# Patient Record
Sex: Male | Born: 1945 | Race: White | Hispanic: No | Marital: Married | Smoking: Never smoker
Health system: Southern US, Community
[De-identification: ages and names within clinical notes are randomized; demographics above are authoritative.]

## PROBLEM LIST (undated history)

## (undated) HISTORY — PX: WRIST SURGERY: SHX841

## (undated) HISTORY — PX: BACK SURGERY: SHX140

---

## 2006-12-10 ENCOUNTER — Ambulatory Visit (HOSPITAL_COMMUNITY): Admission: RE | Admit: 2006-12-10 | Discharge: 2006-12-10 | Payer: Self-pay | Admitting: Neurosurgery

## 2007-02-10 ENCOUNTER — Encounter: Admission: RE | Admit: 2007-02-10 | Discharge: 2007-02-10 | Payer: Self-pay | Admitting: Neurosurgery

## 2007-02-12 ENCOUNTER — Encounter: Admission: RE | Admit: 2007-02-12 | Discharge: 2007-02-12 | Payer: Self-pay | Admitting: Neurosurgery

## 2007-02-23 ENCOUNTER — Encounter: Admission: RE | Admit: 2007-02-23 | Discharge: 2007-02-23 | Payer: Self-pay | Admitting: Neurosurgery

## 2007-03-18 ENCOUNTER — Inpatient Hospital Stay (HOSPITAL_COMMUNITY): Admission: RE | Admit: 2007-03-18 | Discharge: 2007-03-19 | Payer: Self-pay | Admitting: Neurosurgery

## 2009-02-15 ENCOUNTER — Encounter: Admission: RE | Admit: 2009-02-15 | Discharge: 2009-02-15 | Payer: Self-pay | Admitting: Neurosurgery

## 2009-03-07 ENCOUNTER — Ambulatory Visit (HOSPITAL_COMMUNITY): Admission: RE | Admit: 2009-03-07 | Discharge: 2009-03-08 | Payer: Self-pay | Admitting: Neurosurgery

## 2010-06-23 ENCOUNTER — Encounter: Payer: Self-pay | Admitting: Neurosurgery

## 2010-09-05 LAB — BASIC METABOLIC PANEL
BUN: 19 mg/dL (ref 6–23)
CO2: 29 mEq/L (ref 19–32)
Calcium: 9.7 mg/dL (ref 8.4–10.5)
Chloride: 106 mEq/L (ref 96–112)
Creatinine, Ser: 0.92 mg/dL (ref 0.4–1.5)
GFR calc Af Amer: >60 mL/min (ref >60)
GFR calc non Af Amer: >60 mL/min (ref >60)
Glucose, Bld: 100 mg/dL — ABNORMAL HIGH (ref 70–99)
Potassium: 4.4 mEq/L (ref 3.5–5.1)
Sodium: 140 mEq/L (ref 135–145)

## 2010-09-05 LAB — CBC
HCT: 43.7 % (ref 39.0–52.0)
Hemoglobin: 14.8 g/dL (ref 13.0–17.0)
MCHC: 33.8 g/dL (ref 30.0–36.0)
MCV: 89.2 fL (ref 78.0–100.0)
Platelets: 194 10*3/uL (ref 150–400)
RBC: 4.9 MIL/uL (ref 4.22–5.81)
RDW: 12.6 % (ref 11.5–15.5)
WBC: 4.9 10*3/uL (ref 4.0–10.5)

## 2010-10-15 NOTE — Op Note (Signed)
NAMEOSEPH, IMBURGIA NO.:  [id number]   MEDICAL RECORD NO.:  [id number]          PATIENT TYPE:  INP   LOCATION:  3023                         FACILITY:  [facility]   PHYSICIAN:  Hewitt Shorts, M.D.DATE OF BIRTH:  10/01/1945   DATE OF PROCEDURE:  03/18/2007  DATE OF DISCHARGE:  03/19/2007                               OPERATIVE REPORT   PREOPERATIVE DIAGNOSES:  Lumbar stenosis, lumbar spondylosis, lumbar  degenerative disk disease and lumbar radiculopathy.   POSTOPERATIVE DIAGNOSES:  Lumbar stenosis, lumbar spondylosis, lumbar  degenerative disk disease and lumbar radiculopathy.   PROCEDURE:  Left L5-S1 lumbar laminotomy, facetectomy and foraminotomy  with decompression of the left L5 nerve root, a left L5-S1 transverse  posterior lumbar interbody fusion with AVS PEEK interbody implant with  mosaic synthetic bone graft with bone marrow aspirate and bilateral L5-  S1 posterolateral arthrodesis with posterior instrumentation,  specifically left L5-S1 pedicle screw fixation with Tektoniks plate and  screws, and interspinous plate and mosaic with bone marrow aspirate with  microdissection.   SURGEON:  Hewitt Shorts, M.D.   ASSISTANT:  Webb Silversmith, NP, and Lovell Sheehan.   ANESTHESIA:  General endotracheal.   INDICATIONS:  The patient is a 65 year old man.  He is a little over 3  months status post a left L5-S1 lumbar laminotomy and foraminotomy for  decompression of left L5-S1 neural foraminal stenosis with resulting  left lumbar radiculopathy.  He did well initially following the surgery,  but the pain began to recur.  He had several left L5-S1 transforaminal  epidural steroid injections that gave transient but not lasting relief,  and the radicular pain steadily worsened and became increasingly  disabling.  And therefore, more definitive decompression and arthrodesis  was elected.   PROCEDURE:  The patient was brought to the operating room and placed  under  general endotracheal anesthesia.  The patient was turned to a  prone position.  Lumbar region was then prepped with Betadine soap and  solution and draped in a sterile fashion.  The midline was infiltrated  with local anesthetic with epinephrine and the previous midline incision  was reopened and the incision was extended both rostrally and caudally.  Dissection was carried down through the subcutaneous tissue to the  lumbar fascia, which was incised bilaterally, and the paraspinal muscles  were dissected off the spinous process and lamina bilaterally.  The  dissection on the left side where the previous surgery had been was more  difficult due to extensive scar tissue.  X-rays were taken, and we  identified the L5-S1 intralaminar space.  And using loupes  magnification, we defined the margins of the previous laminotomy.  We  then extended the laminectomy rostrally.  The scar tissue over the  thecal sac was quite dense, and a dural tear occurred during the  dissection.  We then went ahead and draped the operating microscope.  It  was brought into the operative field to provide additional  magnification, illumination and visualization, and we went ahead and  repaired the dural tear with interrupted and running 6-0 Prolene sutures  and a good watertight  closure was achieved.  At the end of the  decompression and arthrodesis immediately prior to closure, we injected  a layer of Tisseel over the repair.   The left L5-S1 neural foramen was then decompressed by proceeding with a  left L5-S1 facetectomy using the XMax drill and Kerrison punches.  We  identified the nerve root and were able to remove the left S1 superior  facet entirely.  It was hypertrophic, and the overlying facet capsule  was hypertrophic and compressing the nerve root, and this was removed.  The remaining compression arose from the spondylitic disk protrusion,  and this was subsequently removed.   We then proceeded with  diskectomy on the left side at L5-S1, incising  the anulus and entering through the disk space with a variety of  pituitary rongeurs and microcurettes.  A thorough diskectomy was  performed of the degenerated disk material, and then we worked on  removing the spondylitic spurring from the inferior aspect of the L5 and  the superior aspect of S1, particularly laterally as the spondylitic  spurring encroached on the exiting left L5 nerve root.  This bony  overgrowth was removed and the nerve root further decompressed so that  it was fully decompressed from the medial aspect of the left L5 pedicle  laterally and inferiorly into the neural foramen and into the  extraforaminal space.  We then began to prepare the endplates of the  vertebral body for interbody arthrodesis.  The cartilaginous endplate  surfaces were removed using curettes.  And then we selected a 7-mm  transverse posterior lumbar interbody fusion implant.  It was first  tested with a sizer.  And then we went ahead and probed the left S1  pedicle and aspirated bone marrow aspirate.  It was injected over a 15-  mL strip of mosaic synthetic bone graft.  And once that was saturated,  we packed the interbody implant with the mosaic.  We packed mosaic in  the medial aspect of the disk space and then carefully positioned the  interbody implant in the intervertebral disk space and were able to  position it in a good midline position.  We then took additional mosaic  and packed it around the interbody implant.   We then proceeded with the posterolateral arthrodesis.  The C-arm  fluoroscope was draped and brought on the field.  We identified a  pedicle entry site at the L5 level on the left side.  We examined the  pedicle from within the canal and then carefully probed the left L5  pedicle with fluoroscopic guidance.  It was examined with a ball probe.  No cutouts were found.  It was then tapped with a 5-mm tap and then we  selected a  small singlelevel Tektoniks plate and a 16-XW screw was  placed through the plate and into the left L5 pedicle.  We then  reexamined the left S1 pedicle that had been probed earlier.  Pedicle  probe was again placed and a good trajectory was noted.  We then  examined it with a ball probe; no cutouts were found.  The left S1  pedicle was then tapped and then we placed another 40-mm screw through  the plate.  Both screws were fully tightened, and then we placed locking  caps which were fully tightened.  We then selected a Spire interspinous  plate.  A channel was created in the L5-S1 interspinous space and we  selected a 45-mm plate.  It was carefully  set up, passing the post from  right to left under direct vision.  It was connected to the second  plate, and these were brought together.  The tightening clamps were  applied both rostrally and caudally and gently tightened, and then  locking screw the break-off side was tightened and secured.  And once  the system was locked, the tightening clamps were removed.  This was all  again checked with C-arm fluoroscopic imaging.  We then decorticated the  lamina on the right side at L5 and S1 and packed mosaic with bone marrow  aspirate over the decorticated lamina to decorticate the transverse  process at L5 on the left and the ala of S1, and then mosaic was packed  lateral to the Tektoniks plate.  We then injected the Tisseel over the  dural repair.  The wound was checked for hemostasis.  It had been  irrigated numerous times during the procedure with bacitracin solution.  And once hemostasis was confirmed, we proceeded with closure.  The deep  fascia was closed with interrupted undyed #1 Vicryl sutures.  The  subcutaneous and subcuticular were closed with interrupted inverted 2-0  undyed Vicryl sutures and the skin was reapproximated with Dermabond.  The wound was dressed with Adaptic and sterile gauze.  And following the  procedure, the patient  was turned back to the supine position and  reversed from the anesthetic, extubated and transferred to the recovery  room for further care.   ESTIMATED BLOOD LOSS:  200 mL.  We did use the Cell Saver, but there was  insufficient blood loss to spin down the collected blood product.   SPONGE AND NEEDLE COUNT:  Correct.      Hewitt Shorts, M.D.  Electronically Signed     RWN/MEDQ  D:  03/18/2007  T:  03/19/2007  Job:  [id number]

## 2010-10-15 NOTE — Op Note (Signed)
NAMEESVIN, HNAT               ACCOUNT NO.:  [id number]   MEDICAL RECORD NO.:  [id number]          PATIENT TYPE:  OIB   LOCATION:  3172                         FACILITY:  [facility]   PHYSICIAN:  Hewitt Shorts, M.D.DATE OF BIRTH:  1945/11/10   DATE OF PROCEDURE:  12/10/2006  DATE OF DISCHARGE:                               OPERATIVE REPORT   PREOPERATIVE DIAGNOSIS:  Left L5-S1 neuroforaminal stenosis, lumbar  spondylosis, lumbar degenerative disk disease, lumbar radiculopathy.   POSTOPERATIVE DIAGNOSIS:  Left L5-S1 neuroforaminal stenosis, lumbar  spondylosis, lumbar degenerative disk disease, lumbar radiculopathy.   PROCEDURE:  Left L5-S1 lumbar laminotomy and foraminotomy.   SURGEON:  Hewitt Shorts, M.D.   ASSISTANT:  1. Nelia Shi. Webb Silversmith, RN  2. Hilda Lias, M.D.   ANESTHESIA:  General endotracheal.   INDICATION:  The patient is a 65 year old man who presented with left  lumbar radiculopathy with weakness of his left extensor hallucis longus.  He has extensive multilevel degenerative disk disease with spondylosis  with particular encroachment of the left L5-S1 neuroforamen and  compression of the exiting left L5 nerve root.  The decision was made to  proceed with laminotomy and foraminotomy.   PROCEDURE:  The patient was brought to the operating room, placed under  general endotracheal anesthesia.  The patient was turned to a prone  position.  Lumbar region was prepped with Betadine soap solution and  draped in a sterile fashion.  The midline was infiltrated with local  anesthetic with epinephrine.  An x-ray was taken and the L5-S1 level  identified and a midline incision was made over the L5-S1 level and  carried down through the subcutaneous tissue to the lumbar fascia which  was incised on the let side of the midline.  The paraspinal muscles were  dissected to the spinous process and lamina in a subperiosteal fashion  and an x-ray was taken and the L5-S1  intralaminar space identified.  The  microscope was draped and brought into the field to provide additional  magnification, illumination and visualization.  The decompression was  performed using microdissection and microsurgical technique.  A  laminotomy was performed using the XMax drill and Kerrison punches.  Edges of the bone were waxed as needed to maintain hemostasis.  The  ligamentum flavum was carefully removed and then dissection was carried  out laterally into the left L5-S1 neuroforamen.  There was significant  thickening of the ligament tissues within the neuroforamen and these  were removed using an up-angled curet, variety of microhooks and  Kerrison punches, and we were able to progressively decompress and open  the left L5-S1 neuroforamen and it was felt that we decompressed the  exiting left L5 nerve root.  We examined the L5-S1 disk, although it was  degenerated, there was no disk herniation.  The S1 nerve root exited  without any compression.  Once the decompression was completed,  hemostasis was established with the use of Gelfoam soaked in thrombin.  We did remove all of the Gelfoam prior to closure and confirmed  hemostasis. Once that was done, we instilled 2 mL of fentanyl  and 80 mg  of Depo-Medrol into the epidural space and proceeded with closure.  The  deep fascia was closed with interrupted undyed 1 Vicryl sutures.  The  subcutaneous and subcuticular were closed with interrupted inverted 2-0  and 3-0 undyed Vicryl sutures.  The skin was reapproximated with  Dermabond.  The procedure was tolerated well.  The estimated blood loss  was 25 mL.  Sponge and needle count were correct.   Following surgery, the patient was turned back to supine position,  reversed from anesthetic, extubated and transferred to the recovery room  for further care.      Hewitt Shorts, M.D.  Electronically Signed     RWN/MEDQ  D:  12/10/2006  T:  12/10/2006  Job:  [id number]

## 2011-03-12 LAB — CBC
HCT: 43.5
Hemoglobin: 14.6
MCHC: 33.6
MCV: 88
Platelets: 232
RBC: 4.94
RDW: 12.8
WBC: 6.1

## 2011-03-12 LAB — BASIC METABOLIC PANEL
BUN: 16
CO2: 27
Calcium: 9.2
Chloride: 105
Creatinine, Ser: 0.98
GFR calc Af Amer: >60
GFR calc non Af Amer: >60
Glucose, Bld: 93
Potassium: 4
Sodium: 139

## 2011-03-12 LAB — TYPE AND SCREEN
ABO/RH(D): A POS
Antibody Screen: NEGATIVE

## 2011-03-12 LAB — ABO/RH: ABO/RH(D): A POS

## 2011-03-18 LAB — CBC
HCT: 40.8
Hemoglobin: 13.7
MCHC: 33.6
MCV: 87.1
Platelets: 317
RBC: 4.69
RDW: 12.4
WBC: 9.2

## 2011-08-21 IMAGING — CR DG CERVICAL SPINE 2 OR 3 VIEWS
1 series · 1 of 1 positions shown · non-contrast
Comparison: [DATE]

CLINICAL DATA: C4-C6 ACDF

CERVICAL SPINE - 2-3 VIEW

[view not recorded]
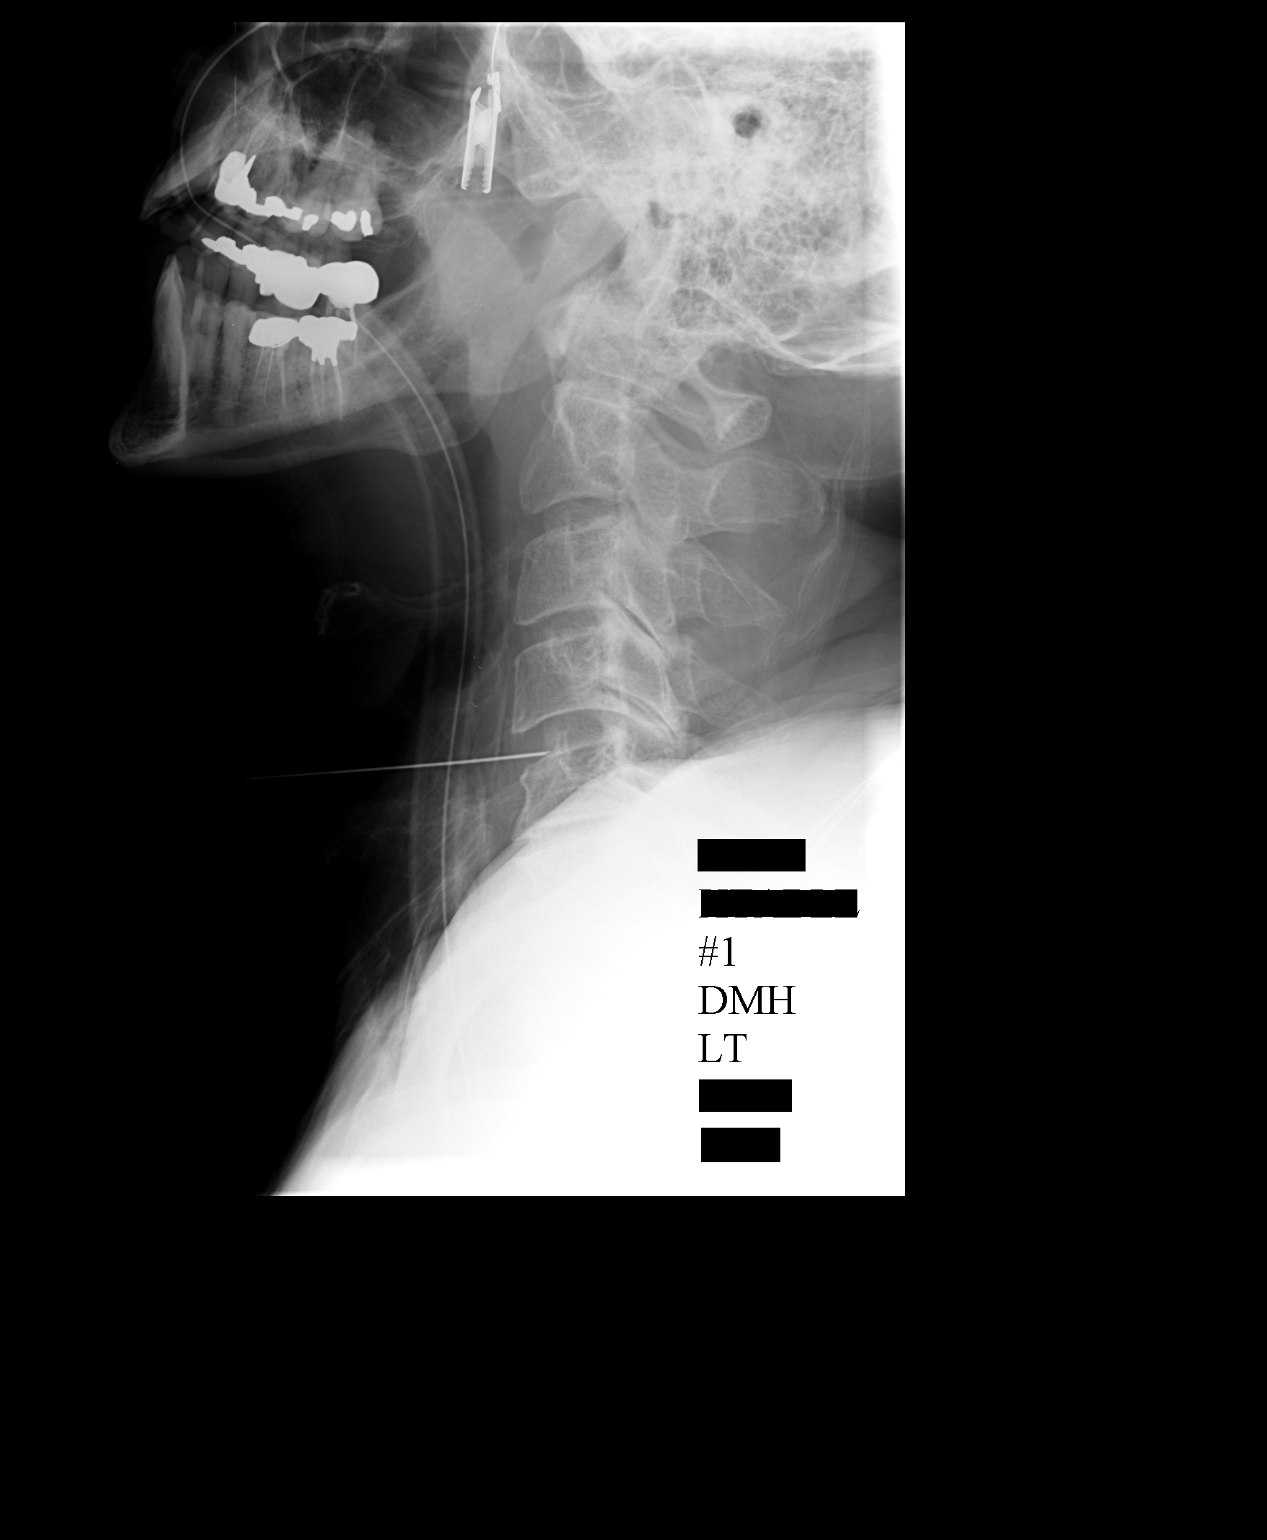

[1 of 1 positions shown; findings below may reference images not displayed]

FINDINGS: An initial film shows a needle marking the anterior disc
space of C4-5.  Second film shows anterior cervical discectomy and
fusion from C4-C6.  Interbody fusion material appears well
positioned.  There is an anterior plate with screw fixation.
Sponges are in place along operative approach.
IMPRESSION: ACDF C4-C6

## 2014-07-07 DIAGNOSIS — R51 Headache: Secondary | ICD-10-CM | POA: Diagnosis not present

## 2015-05-08 DIAGNOSIS — Z23 Encounter for immunization: Secondary | ICD-10-CM | POA: Diagnosis not present

## 2015-05-16 ENCOUNTER — Ambulatory Visit: Payer: Self-pay | Admitting: Allergy and Immunology

## 2015-05-17 ENCOUNTER — Ambulatory Visit (INDEPENDENT_AMBULATORY_CARE_PROVIDER_SITE_OTHER): Payer: Medicare Other | Admitting: Allergy and Immunology

## 2015-05-17 ENCOUNTER — Encounter: Payer: Self-pay | Admitting: Allergy and Immunology

## 2015-05-17 VITALS — BP 138/84 | HR 72 | Temp 97.9°F | Resp 20 | Ht 71.0 in | Wt 176.4 lb

## 2015-05-17 DIAGNOSIS — G43009 Migraine without aura, not intractable, without status migrainosus: Secondary | ICD-10-CM | POA: Diagnosis not present

## 2015-05-17 DIAGNOSIS — J3089 Other allergic rhinitis: Secondary | ICD-10-CM | POA: Diagnosis not present

## 2015-05-17 NOTE — Patient Instructions (Signed)
  1. Slowly taper off all caffeine and chocolate  2. Continue Claritin 10 mg tablet 1 tablet once a day  3. Contact clinic in 6-8 weeks with response

## 2015-05-17 NOTE — Progress Notes (Signed)
[facility]    NEW PATIENT NOTE  Referring Provider: No ref. provider found Primary Provider: Charletta Cousin, MD    Subjective:   Chief Complaint:  Matthew Davidson is a 69 y.o. male with a chief complaint of New Patient (Initial Visit)  who presents to the clinic with the following problems:   HPI Comments:  Matthew Davidson presents this clinic on 05/17/2015 in evaluation of allergies and headache. He has a long history of allergic rhinitis for which she takes Claritin which she thinks works relatively well. He's also been having problems with headaches. For about 6 years she's been having this right frontal headache that migrates to the back of his head and is described as pounding. Interestingly this headache appears to be worse when recombinant. He can't really sleep when he has his headache and he has to get up in walk around. He has no associated scotoma or other neurological symptoms. He is seen Dr. Frutoso Schatz was performed rhinoscopy which apparently was normal and he had a CT scan of the sinuses which apparently is normal. Sometimes when he drinks wine or beer this will precipitate his headache. His frequency of headache is about 1 time per week. Regarding possible triggers. He does drink an extensive amount of caffeine and has coffee throughout the day. He predominantly drinks espresso. As well, he has sweet tea. And he eats chocolate every day. He's also had a fusion of his C5-6 disc.   History reviewed. No pertinent past medical history.  Past Surgical History  Procedure Laterality Date  . Wrist surgery    . Back surgery      Outpatient Encounter Prescriptions as of 05/17/2015  Medication Sig  . loratadine (CLARITIN) 10 MG tablet Take 10 mg by mouth daily as needed for allergies.   No facility-administered encounter medications on file as of 05/17/2015.    No orders of the defined types were placed in this encounter.    No  Known Allergies  Review of Systems  Constitutional: Negative.   Eyes: Negative.   Respiratory: Negative.   Cardiovascular: Negative.   Musculoskeletal: Negative.   Skin: Negative.   Allergic/Immunologic: Positive for environmental allergies.  Neurological: Positive for headaches.  Hematological: Negative.   Psychiatric/Behavioral: Negative.     Family History  Problem Relation Age of Onset  . Allergic rhinitis Neg Hx   . Angioedema Neg Hx   . Asthma Neg Hx   . Atopy Neg Hx   . Immunodeficiency Neg Hx   . Eczema Neg Hx   . Urticaria Neg Hx     Social History   Social History  . Marital Status: Married    Spouse Name: N/A  . Number of Children: N/A  . Years of Education: N/A   Occupational History  . Not on file.   Social History Main Topics  . Smoking status: Never Smoker   . Smokeless tobacco: Not on file  . Alcohol Use: 0.6 oz/week    1 Glasses of wine per week  . Drug Use: No  . Sexual Activity: Not on file   Other Topics Concern  . Not on file   Social History Narrative  . No narrative on file    Environmental and Social history     Objective:   Filed Vitals:   05/17/15 1408  BP: 138/84  Pulse: 72  Temp: 97.9 F (36.6 C)  Resp: 20   Height: 5\' 11"  (180.3 cm) Weight:  176 lb 5.9 oz (80 kg)  Physical Exam  Constitutional: He appears well-developed and well-nourished. No distress.  HENT:  Head: Normocephalic and atraumatic. Head is without right periorbital erythema and without left periorbital erythema.  Right Ear: Tympanic membrane, external ear and ear canal normal. No drainage or tenderness. No foreign bodies. Tympanic membrane is not injected, not scarred, not perforated, not erythematous, not retracted and not bulging. No middle ear effusion.  Left Ear: Tympanic membrane, external ear and ear canal normal. No drainage or tenderness. No foreign bodies. Tympanic membrane is not injected, not scarred, not perforated, not erythematous, not  retracted and not bulging.  No middle ear effusion.  Nose: Nose normal. No mucosal edema, rhinorrhea, nose lacerations or sinus tenderness.  No foreign bodies.  Mouth/Throat: Oropharynx is clear and moist. No oropharyngeal exudate, posterior oropharyngeal edema, posterior oropharyngeal erythema or tonsillar abscesses.  Eyes: Lids are normal. Right eye exhibits no chemosis, no discharge and no exudate. No foreign body present in the right eye. Left eye exhibits no chemosis, no discharge and no exudate. No foreign body present in the left eye. Right conjunctiva is not injected. Left conjunctiva is not injected.  Neck: Neck supple. No tracheal tenderness present. No tracheal deviation and no edema present. No thyroid mass and no thyromegaly present.  Cardiovascular: Normal rate, regular rhythm, S1 normal and S2 normal.  Exam reveals no gallop.   No murmur heard. Pulmonary/Chest: No accessory muscle usage or stridor. No respiratory distress. He has no wheezes. He has no rhonchi. He has no rales.  Abdominal: Soft. He exhibits no distension and no mass. There is no tenderness. There is no rebound.  Musculoskeletal: He exhibits no edema.  Lymphadenopathy:       Head (right side): No tonsillar adenopathy present.       Head (left side): No tonsillar adenopathy present.    He has no cervical adenopathy.  Neurological: He is alert.  Skin: No rash noted. He is not diaphoretic.  Psychiatric: He has a normal mood and affect. His behavior is normal.    Diagnostics: None  Assessment and Plan:    1. Migraine without aura and without status migrainosus, not intractable   2. Other allergic rhinitis      1. Slowly taper off all caffeine and chocolate  2. Continue Claritin 10 mg tablet 1 tablet once a day  3. Contact clinic in 6-8 weeks with response  I think that time as having migraine headaches and he needs to come off his extensive caffeine use and chocolate use. I had a long talk with him today  about a plan he can come up with an attempt to taper off this over the course of the next 4 weeks. I suspect that if he can eliminate caffeine from his diet then he will have much less intense and frequent headaches and he will probably lose his triggers of alcohol consumption for his apparent migraine. He's going to contact me by telephone in 6-8 weeks with an update guarding his response.    Allena Katz, MD [place]

## 2016-09-16 DIAGNOSIS — Z131 Encounter for screening for diabetes mellitus: Secondary | ICD-10-CM | POA: Diagnosis not present

## 2016-09-16 DIAGNOSIS — Z1211 Encounter for screening for malignant neoplasm of colon: Secondary | ICD-10-CM | POA: Diagnosis not present

## 2016-09-16 DIAGNOSIS — R51 Headache: Secondary | ICD-10-CM | POA: Diagnosis not present

## 2016-09-16 DIAGNOSIS — Z136 Encounter for screening for cardiovascular disorders: Secondary | ICD-10-CM | POA: Diagnosis not present

## 2016-09-16 DIAGNOSIS — Z1389 Encounter for screening for other disorder: Secondary | ICD-10-CM | POA: Diagnosis not present

## 2016-09-16 DIAGNOSIS — Z9181 History of falling: Secondary | ICD-10-CM | POA: Diagnosis not present

## 2016-09-16 DIAGNOSIS — Z79899 Other long term (current) drug therapy: Secondary | ICD-10-CM | POA: Diagnosis not present

## 2016-09-16 DIAGNOSIS — Z125 Encounter for screening for malignant neoplasm of prostate: Secondary | ICD-10-CM | POA: Diagnosis not present

## 2016-09-16 DIAGNOSIS — R739 Hyperglycemia, unspecified: Secondary | ICD-10-CM | POA: Diagnosis not present

## 2016-09-30 DIAGNOSIS — R972 Elevated prostate specific antigen [PSA]: Secondary | ICD-10-CM | POA: Diagnosis not present

## 2016-09-30 DIAGNOSIS — R8290 Unspecified abnormal findings in urine: Secondary | ICD-10-CM | POA: Diagnosis not present

## 2016-11-06 DIAGNOSIS — L570 Actinic keratosis: Secondary | ICD-10-CM | POA: Diagnosis not present

## 2016-11-06 DIAGNOSIS — L578 Other skin changes due to chronic exposure to nonionizing radiation: Secondary | ICD-10-CM | POA: Diagnosis not present

## 2016-11-06 DIAGNOSIS — C44519 Basal cell carcinoma of skin of other part of trunk: Secondary | ICD-10-CM | POA: Diagnosis not present

## 2016-11-06 DIAGNOSIS — C44529 Squamous cell carcinoma of skin of other part of trunk: Secondary | ICD-10-CM | POA: Diagnosis not present

## 2016-11-06 DIAGNOSIS — D045 Carcinoma in situ of skin of trunk: Secondary | ICD-10-CM | POA: Diagnosis not present

## 2016-11-10 DIAGNOSIS — C44519 Basal cell carcinoma of skin of other part of trunk: Secondary | ICD-10-CM | POA: Diagnosis not present

## 2016-11-13 DIAGNOSIS — R972 Elevated prostate specific antigen [PSA]: Secondary | ICD-10-CM | POA: Diagnosis not present

## 2016-11-14 DIAGNOSIS — D045 Carcinoma in situ of skin of trunk: Secondary | ICD-10-CM | POA: Diagnosis not present

## 2016-11-14 DIAGNOSIS — L02212 Cutaneous abscess of back [any part, except buttock]: Secondary | ICD-10-CM | POA: Diagnosis not present

## 2017-05-07 DIAGNOSIS — Z23 Encounter for immunization: Secondary | ICD-10-CM | POA: Diagnosis not present

## 2017-05-07 DIAGNOSIS — Z131 Encounter for screening for diabetes mellitus: Secondary | ICD-10-CM | POA: Diagnosis not present

## 2017-05-07 DIAGNOSIS — R972 Elevated prostate specific antigen [PSA]: Secondary | ICD-10-CM | POA: Diagnosis not present

## 2018-01-04 DIAGNOSIS — Z0100 Encounter for examination of eyes and vision without abnormal findings: Secondary | ICD-10-CM | POA: Diagnosis not present

## 2018-07-16 DIAGNOSIS — R490 Dysphonia: Secondary | ICD-10-CM | POA: Diagnosis not present

## 2018-07-16 DIAGNOSIS — J342 Deviated nasal septum: Secondary | ICD-10-CM | POA: Diagnosis not present

## 2018-07-16 DIAGNOSIS — H6121 Impacted cerumen, right ear: Secondary | ICD-10-CM | POA: Diagnosis not present

## 2018-07-16 DIAGNOSIS — J383 Other diseases of vocal cords: Secondary | ICD-10-CM | POA: Diagnosis not present

## 2018-07-16 DIAGNOSIS — R6889 Other general symptoms and signs: Secondary | ICD-10-CM | POA: Diagnosis not present

## 2018-07-16 DIAGNOSIS — R05 Cough: Secondary | ICD-10-CM | POA: Diagnosis not present

## 2018-08-30 DIAGNOSIS — Z01818 Encounter for other preprocedural examination: Secondary | ICD-10-CM | POA: Diagnosis not present

## 2018-09-02 DIAGNOSIS — R490 Dysphonia: Secondary | ICD-10-CM | POA: Diagnosis not present

## 2018-09-02 DIAGNOSIS — J387 Other diseases of larynx: Secondary | ICD-10-CM | POA: Diagnosis not present

## 2018-09-02 DIAGNOSIS — J342 Deviated nasal septum: Secondary | ICD-10-CM | POA: Diagnosis not present

## 2018-09-02 DIAGNOSIS — J383 Other diseases of vocal cords: Secondary | ICD-10-CM | POA: Diagnosis not present

## 2018-09-15 DIAGNOSIS — J383 Other diseases of vocal cords: Secondary | ICD-10-CM | POA: Diagnosis not present

## 2018-09-15 DIAGNOSIS — Z85828 Personal history of other malignant neoplasm of skin: Secondary | ICD-10-CM | POA: Diagnosis not present

## 2018-09-15 DIAGNOSIS — J387 Other diseases of larynx: Secondary | ICD-10-CM | POA: Diagnosis not present

## 2018-09-15 DIAGNOSIS — R490 Dysphonia: Secondary | ICD-10-CM | POA: Diagnosis not present

## 2018-09-15 DIAGNOSIS — J342 Deviated nasal septum: Secondary | ICD-10-CM | POA: Diagnosis not present

## 2018-09-15 DIAGNOSIS — J382 Nodules of vocal cords: Secondary | ICD-10-CM | POA: Diagnosis not present

## 2018-09-23 DIAGNOSIS — Z9889 Other specified postprocedural states: Secondary | ICD-10-CM | POA: Diagnosis not present

## 2018-09-23 DIAGNOSIS — R490 Dysphonia: Secondary | ICD-10-CM | POA: Diagnosis not present

## 2018-09-23 DIAGNOSIS — J383 Other diseases of vocal cords: Secondary | ICD-10-CM | POA: Diagnosis not present

## 2018-09-23 DIAGNOSIS — J342 Deviated nasal septum: Secondary | ICD-10-CM | POA: Diagnosis not present

## 2018-11-08 DIAGNOSIS — L578 Other skin changes due to chronic exposure to nonionizing radiation: Secondary | ICD-10-CM | POA: Diagnosis not present

## 2018-11-08 DIAGNOSIS — L570 Actinic keratosis: Secondary | ICD-10-CM | POA: Diagnosis not present

## 2018-11-08 DIAGNOSIS — L821 Other seborrheic keratosis: Secondary | ICD-10-CM | POA: Diagnosis not present

## 2019-02-22 DIAGNOSIS — L821 Other seborrheic keratosis: Secondary | ICD-10-CM | POA: Diagnosis not present

## 2019-02-22 DIAGNOSIS — L578 Other skin changes due to chronic exposure to nonionizing radiation: Secondary | ICD-10-CM | POA: Diagnosis not present

## 2019-02-22 DIAGNOSIS — L570 Actinic keratosis: Secondary | ICD-10-CM | POA: Diagnosis not present

## 2019-03-01 DIAGNOSIS — R69 Illness, unspecified: Secondary | ICD-10-CM | POA: Diagnosis not present

## 2019-03-24 DIAGNOSIS — J383 Other diseases of vocal cords: Secondary | ICD-10-CM | POA: Diagnosis not present

## 2019-03-24 DIAGNOSIS — Z9889 Other specified postprocedural states: Secondary | ICD-10-CM | POA: Diagnosis not present

## 2019-03-24 DIAGNOSIS — R490 Dysphonia: Secondary | ICD-10-CM | POA: Diagnosis not present

## 2019-05-04 DIAGNOSIS — R69 Illness, unspecified: Secondary | ICD-10-CM | POA: Diagnosis not present

## 2019-05-16 DIAGNOSIS — L578 Other skin changes due to chronic exposure to nonionizing radiation: Secondary | ICD-10-CM | POA: Diagnosis not present

## 2019-05-16 DIAGNOSIS — L821 Other seborrheic keratosis: Secondary | ICD-10-CM | POA: Diagnosis not present

## 2019-05-16 DIAGNOSIS — L570 Actinic keratosis: Secondary | ICD-10-CM | POA: Diagnosis not present

## 2019-09-15 DIAGNOSIS — L570 Actinic keratosis: Secondary | ICD-10-CM | POA: Diagnosis not present

## 2019-09-15 DIAGNOSIS — L578 Other skin changes due to chronic exposure to nonionizing radiation: Secondary | ICD-10-CM | POA: Diagnosis not present

## 2019-09-15 DIAGNOSIS — L821 Other seborrheic keratosis: Secondary | ICD-10-CM | POA: Diagnosis not present

## 2019-09-27 DIAGNOSIS — J383 Other diseases of vocal cords: Secondary | ICD-10-CM | POA: Diagnosis not present

## 2019-09-27 DIAGNOSIS — R490 Dysphonia: Secondary | ICD-10-CM | POA: Diagnosis not present

## 2019-09-27 DIAGNOSIS — J302 Other seasonal allergic rhinitis: Secondary | ICD-10-CM | POA: Diagnosis not present

## 2019-11-03 DIAGNOSIS — R69 Illness, unspecified: Secondary | ICD-10-CM | POA: Diagnosis not present

## 2019-12-15 DIAGNOSIS — L570 Actinic keratosis: Secondary | ICD-10-CM | POA: Diagnosis not present

## 2019-12-15 DIAGNOSIS — L578 Other skin changes due to chronic exposure to nonionizing radiation: Secondary | ICD-10-CM | POA: Diagnosis not present

## 2019-12-15 DIAGNOSIS — L821 Other seborrheic keratosis: Secondary | ICD-10-CM | POA: Diagnosis not present

## 2020-01-24 DIAGNOSIS — Z0100 Encounter for examination of eyes and vision without abnormal findings: Secondary | ICD-10-CM | POA: Diagnosis not present

## 2020-01-30 DIAGNOSIS — Z0100 Encounter for examination of eyes and vision without abnormal findings: Secondary | ICD-10-CM | POA: Diagnosis not present

## 2020-02-29 DIAGNOSIS — R69 Illness, unspecified: Secondary | ICD-10-CM | POA: Diagnosis not present

## 2020-03-16 DIAGNOSIS — L570 Actinic keratosis: Secondary | ICD-10-CM | POA: Diagnosis not present

## 2020-03-16 DIAGNOSIS — D1722 Benign lipomatous neoplasm of skin and subcutaneous tissue of left arm: Secondary | ICD-10-CM | POA: Diagnosis not present

## 2020-03-16 DIAGNOSIS — C44529 Squamous cell carcinoma of skin of other part of trunk: Secondary | ICD-10-CM | POA: Diagnosis not present

## 2020-04-03 DIAGNOSIS — D1722 Benign lipomatous neoplasm of skin and subcutaneous tissue of left arm: Secondary | ICD-10-CM | POA: Diagnosis not present

## 2020-04-03 DIAGNOSIS — C44529 Squamous cell carcinoma of skin of other part of trunk: Secondary | ICD-10-CM | POA: Diagnosis not present

## 2020-05-10 DIAGNOSIS — R69 Illness, unspecified: Secondary | ICD-10-CM | POA: Diagnosis not present
